# Patient Record
Sex: Female | Born: 1975 | Race: Black or African American | Hispanic: No | Marital: Married | Smoking: Current every day smoker
Health system: Southern US, Community
[De-identification: ages and names within clinical notes are randomized; demographics above are authoritative.]

---

## 2020-01-14 ENCOUNTER — Inpatient Hospital Stay (HOSPITAL_COMMUNITY)
Admission: EM | Admit: 2020-01-14 | Discharge: 2020-01-16 | DRG: 419 | Disposition: A | Discharge status: Home / Self Care | Payer: Medicaid Other | Attending: General Surgery | Admitting: General Surgery

## 2020-01-14 ENCOUNTER — Emergency Department (HOSPITAL_COMMUNITY): Payer: Medicaid Other

## 2020-01-14 ENCOUNTER — Encounter (HOSPITAL_COMMUNITY): Payer: Self-pay

## 2020-01-14 ENCOUNTER — Other Ambulatory Visit: Payer: Self-pay

## 2020-01-14 DIAGNOSIS — K219 Gastro-esophageal reflux disease without esophagitis: Secondary | ICD-10-CM | POA: Diagnosis present

## 2020-01-14 DIAGNOSIS — R1011 Right upper quadrant pain: Secondary | ICD-10-CM | POA: Diagnosis present

## 2020-01-14 DIAGNOSIS — Z79899 Other long term (current) drug therapy: Secondary | ICD-10-CM

## 2020-01-14 DIAGNOSIS — R739 Hyperglycemia, unspecified: Secondary | ICD-10-CM

## 2020-01-14 DIAGNOSIS — F17210 Nicotine dependence, cigarettes, uncomplicated: Secondary | ICD-10-CM | POA: Diagnosis present

## 2020-01-14 DIAGNOSIS — K8000 Calculus of gallbladder with acute cholecystitis without obstruction: Secondary | ICD-10-CM | POA: Diagnosis present

## 2020-01-14 DIAGNOSIS — K8020 Calculus of gallbladder without cholecystitis without obstruction: Secondary | ICD-10-CM

## 2020-01-14 DIAGNOSIS — Z888 Allergy status to other drugs, medicaments and biological substances status: Secondary | ICD-10-CM | POA: Diagnosis not present

## 2020-01-14 DIAGNOSIS — E669 Obesity, unspecified: Secondary | ICD-10-CM | POA: Diagnosis present

## 2020-01-14 DIAGNOSIS — Z6835 Body mass index (BMI) 35.0-35.9, adult: Secondary | ICD-10-CM

## 2020-01-14 DIAGNOSIS — K7581 Nonalcoholic steatohepatitis (NASH): Secondary | ICD-10-CM | POA: Diagnosis present

## 2020-01-14 DIAGNOSIS — Z90711 Acquired absence of uterus with remaining cervical stump: Secondary | ICD-10-CM

## 2020-01-14 DIAGNOSIS — Z20822 Contact with and (suspected) exposure to covid-19: Secondary | ICD-10-CM | POA: Diagnosis present

## 2020-01-14 DIAGNOSIS — K8012 Calculus of gallbladder with acute and chronic cholecystitis without obstruction: Secondary | ICD-10-CM | POA: Diagnosis present

## 2020-01-14 LAB — CBC WITH DIFFERENTIAL/PLATELET
Abs Immature Granulocytes: 0.06 10*3/uL (ref 0.00–0.07)
Basophils Absolute: 0 10*3/uL (ref 0.0–0.1)
Basophils Relative: 0 %
Eosinophils Absolute: 0 10*3/uL (ref 0.0–0.5)
Eosinophils Relative: 0 %
HCT: 41.9 % (ref 36.0–46.0)
Hemoglobin: 13.8 g/dL (ref 12.0–15.0)
Immature Granulocytes: 1 %
Lymphocytes Relative: 12 %
Lymphs Abs: 1.4 10*3/uL (ref 0.7–4.0)
MCH: 31.4 pg (ref 26.0–34.0)
MCHC: 32.9 g/dL (ref 30.0–36.0)
MCV: 95.2 fL (ref 80.0–100.0)
Monocytes Absolute: 0.3 10*3/uL (ref 0.1–1.0)
Monocytes Relative: 3 %
Neutro Abs: 10.4 10*3/uL — ABNORMAL HIGH (ref 1.7–7.7)
Neutrophils Relative %: 84 %
Platelets: 422 10*3/uL — ABNORMAL HIGH (ref 150–400)
RBC: 4.4 MIL/uL (ref 3.87–5.11)
RDW: 14.8 % (ref 11.5–15.5)
WBC: 12.2 10*3/uL — ABNORMAL HIGH (ref 4.0–10.5)
nRBC: 0 % (ref 0.0–0.2)

## 2020-01-14 LAB — COMPREHENSIVE METABOLIC PANEL
ALT: 25 U/L (ref 0–44)
AST: 21 U/L (ref 15–41)
Albumin: 4.2 g/dL (ref 3.5–5.0)
Alkaline Phosphatase: 58 U/L (ref 38–126)
Anion gap: 9 (ref 5–15)
BUN: 8 mg/dL (ref 6–20)
CO2: 28 mmol/L (ref 22–32)
Calcium: 8.8 mg/dL — ABNORMAL LOW (ref 8.9–10.3)
Chloride: 103 mmol/L (ref 98–111)
Creatinine, Ser: 0.67 mg/dL (ref 0.44–1.00)
GFR calc Af Amer: >60 mL/min (ref >60)
GFR calc non Af Amer: >60 mL/min (ref >60)
Glucose, Bld: 128 mg/dL — ABNORMAL HIGH (ref 70–99)
Potassium: 3.5 mmol/L (ref 3.5–5.1)
Sodium: 140 mmol/L (ref 135–145)
Total Bilirubin: 0.5 mg/dL (ref 0.3–1.2)
Total Protein: 8.1 g/dL (ref 6.5–8.1)

## 2020-01-14 LAB — URINALYSIS, ROUTINE W REFLEX MICROSCOPIC
Bilirubin Urine: NEGATIVE
Glucose, UA: NEGATIVE mg/dL
Hgb urine dipstick: NEGATIVE
Ketones, ur: 20 mg/dL — AB
Leukocytes,Ua: NEGATIVE
Nitrite: NEGATIVE
Protein, ur: NEGATIVE mg/dL
Specific Gravity, Urine: 1.016 (ref 1.005–1.030)
pH: 9 — ABNORMAL HIGH (ref 5.0–8.0)

## 2020-01-14 LAB — LIPASE, BLOOD: Lipase: 24 U/L (ref 11–51)

## 2020-01-14 LAB — RESPIRATORY PANEL BY RT PCR (FLU A&B, COVID)
Influenza A by PCR: NEGATIVE
Influenza B by PCR: NEGATIVE
SARS Coronavirus 2 by RT PCR: NEGATIVE

## 2020-01-14 LAB — I-STAT BETA HCG BLOOD, ED (MC, WL, AP ONLY): I-stat hCG, quantitative: <5 m[IU]/mL (ref <5)

## 2020-01-14 MED ORDER — ONDANSETRON 4 MG PO TBDP: 4.0000 mg | ORAL_TABLET | Freq: Four times a day (QID) | ORAL | Status: DC | PRN

## 2020-01-14 MED ORDER — MORPHINE SULFATE (PF) 2 MG/ML IV SOLN
1.0000 mg | INTRAVENOUS | Status: DC | PRN
  Administered 2020-01-14 – 2020-01-15 (×3): 2 mg via INTRAVENOUS
  Filled 2020-01-14 (×3): qty 1

## 2020-01-14 MED ORDER — ONDANSETRON HCL 4 MG/2ML IJ SOLN
4.0000 mg | Freq: Once | INTRAMUSCULAR | Status: AC
  Administered 2020-01-14: 15:00:00 4 mg via INTRAVENOUS
  Filled 2020-01-14: qty 2

## 2020-01-14 MED ORDER — HYDROMORPHONE HCL 1 MG/ML IJ SOLN
1.0000 mg | Freq: Once | INTRAMUSCULAR | Status: AC
  Administered 2020-01-14: 16:00:00 1 mg via INTRAVENOUS
  Filled 2020-01-14: qty 1

## 2020-01-14 MED ORDER — OXYCODONE HCL 5 MG PO TABS
5.0000 mg | ORAL_TABLET | ORAL | Status: DC | PRN
  Administered 2020-01-15 – 2020-01-16 (×4): 10 mg via ORAL
  Filled 2020-01-14 (×5): qty 2

## 2020-01-14 MED ORDER — ONDANSETRON 8 MG PO TBDP: 8.0000 mg | ORAL_TABLET | Freq: Once | ORAL | Status: DC

## 2020-01-14 MED ORDER — PROMETHAZINE HCL 25 MG/ML IJ SOLN
12.5000 mg | Freq: Once | INTRAMUSCULAR | Status: AC
  Administered 2020-01-14: 16:00:00 12.5 mg via INTRAVENOUS
  Filled 2020-01-14: qty 1

## 2020-01-14 MED ORDER — ONDANSETRON HCL 4 MG/2ML IJ SOLN
4.0000 mg | Freq: Four times a day (QID) | INTRAMUSCULAR | Status: DC | PRN
  Administered 2020-01-14: 4 mg via INTRAVENOUS
  Filled 2020-01-14: qty 2

## 2020-01-14 MED ORDER — HYDROMORPHONE HCL 1 MG/ML IJ SOLN
1.0000 mg | Freq: Once | INTRAMUSCULAR | Status: AC
  Administered 2020-01-14: 20:00:00 1 mg via INTRAVENOUS
  Filled 2020-01-14: qty 1

## 2020-01-14 MED ORDER — SODIUM CHLORIDE 0.9 % IV BOLUS
1000.0000 mL | Freq: Once | INTRAVENOUS | Status: AC
  Administered 2020-01-14: 18:00:00 1000 mL via INTRAVENOUS

## 2020-01-14 MED ORDER — MORPHINE SULFATE (PF) 4 MG/ML IV SOLN
4.0000 mg | Freq: Once | INTRAVENOUS | Status: AC
  Administered 2020-01-14: 15:00:00 4 mg via INTRAVENOUS
  Filled 2020-01-14: qty 1

## 2020-01-14 NOTE — ED Notes (Signed)
Attempted to call report to floor, RN in a room and will call right back.

## 2020-01-14 NOTE — ED Provider Notes (Signed)
[facility] DEPT Provider Note   CSN: [id number] Arrival date & time: 01/14/20  1257     History Chief Complaint  Patient presents with  . Abdominal Pain  . Emesis    Lindsey Gordon is a 44 y.o. female with no significant PMH who presents with abdominal pain. She states the pain started acuely around 2AM this morning. It is in the RUQ and radiates to the epigastric region and her back. She reports associated N/V that is bilious in nature. She tried several OTC meds for pain/gas without relief. She at crabs and cookout yesterday. She has had this pain before but it's never been this bad and usually resolves. The pain is severe, sharp, constant. No fever, chills, chest pain, SOB, diarrhea/constipation, urinary symptoms or vaginal discharge. She moved her from [facility] 2 months ago. She reports hx of partial hysterectomy.   HPI     History reviewed. No pertinent past medical history.  There are no problems to display for this patient.   History reviewed. No pertinent surgical history.   OB History    Gravida  5   Para  5   Term  5   Preterm      AB      Living  5     SAB      TAB      Ectopic      Multiple      Live Births              History reviewed. No pertinent family history.  Social History   Tobacco Use  . Smoking status: Current Every Day Smoker    Packs/day: 0.25    Types: Cigarettes  . Smokeless tobacco: Never Used  Substance Use Topics  . Alcohol use: Not Currently  . Drug use: Never    Home Medications Prior to Admission medications   Not on File    Allergies    Patient has no allergy information on record.  Review of Systems   Review of Systems  Constitutional: Negative for chills and fever.  Respiratory: Negative for shortness of breath.   Cardiovascular: Negative for chest pain.  Gastrointestinal: Positive for abdominal pain, nausea and vomiting. Negative for constipation and diarrhea.    Genitourinary: Negative for dysuria, flank pain, pelvic pain, vaginal bleeding and vaginal discharge.  Musculoskeletal: Positive for back pain.  All other systems reviewed and are negative.   Physical Exam Updated Vital Signs BP (!) 147/84 (BP Location: Left Arm)   Pulse 68   Temp 98.5 F (36.9 C) (Oral)   Resp 19   Ht 5\' 5"  (1.651 m)   Wt 95.3 kg   SpO2 96%   BMI 34.95 kg/m   Physical Exam Vitals and nursing note reviewed.  Constitutional:      General: She is in acute distress (rocking back and forth).     Appearance: She is well-developed. She is obese.  HENT:     Head: Normocephalic and atraumatic.  Eyes:     General: No scleral icterus.       Right eye: No discharge.        Left eye: No discharge.     Conjunctiva/sclera: Conjunctivae normal.     Pupils: Pupils are equal, round, and reactive to light.  Cardiovascular:     Rate and Rhythm: Normal rate and regular rhythm.  Pulmonary:     Effort: Pulmonary effort is normal. No respiratory distress.     Breath sounds: Normal  breath sounds.  Abdominal:     General: There is no distension.     Palpations: Abdomen is soft.     Tenderness: There is abdominal tenderness (epigastric and RUQ tenderness).  Musculoskeletal:     Cervical back: Normal range of motion.  Skin:    General: Skin is warm and dry.  Neurological:     Mental Status: She is alert and oriented to person, place, and time.  Psychiatric:        Behavior: Behavior normal.     ED Results / Procedures / Treatments   Labs (all labs ordered are listed, but only abnormal results are displayed) Labs Reviewed  CBC WITH DIFFERENTIAL/PLATELET - Abnormal; Notable for the following components:      Result Value   WBC 12.2 (*)    Platelets 422 (*)    Neutro Abs 10.4 (*)    All other components within normal limits  COMPREHENSIVE METABOLIC PANEL - Abnormal; Notable for the following components:   Glucose, Bld 128 (*)    Calcium 8.8 (*)    All other  components within normal limits  URINALYSIS, ROUTINE W REFLEX MICROSCOPIC - Abnormal; Notable for the following components:   pH 9.0 (*)    Ketones, ur 20 (*)    All other components within normal limits  COMPREHENSIVE METABOLIC PANEL - Abnormal; Notable for the following components:   Potassium 3.1 (*)    Glucose, Bld 131 (*)    BUN <5 (*)    Calcium 8.4 (*)    All other components within normal limits  CBC - Abnormal; Notable for the following components:   WBC 16.7 (*)    Platelets 429 (*)    All other components within normal limits  RESPIRATORY PANEL BY RT PCR (FLU A&B, COVID)  LIPASE, BLOOD  HIV ANTIBODY (ROUTINE TESTING W REFLEX)  I-STAT BETA HCG BLOOD, ED ([facility], [facility], [facility] ONLY)    EKG None  Radiology No results found.  Procedures Procedures (including critical care time)  Medications Ordered in ED Medications  morphine 4 MG/ML injection 4 mg (has no administration in time range)  ondansetron (ZOFRAN) injection 4 mg (has no administration in time range)    ED Course  I have reviewed the triage vital signs and the nursing notes.  Pertinent labs & imaging results that were available during my care of the patient were reviewed by me and considered in my medical decision making (see chart for details).  44 year old with acute RUQ abdominal pain, N/V. She is mildly hypertensive but otherwise vitals are normal. She is tender in the epigastric and RUQ. Will obtain labs, UA, RUQ [place]. Will order Morphine and Zofran.  Pt had no relief with meds. Will order Dilaudid and Phenergan as well as fluids.   CBC shows mild leukocytosis (12). CMP and lipase are reassuring. UA is still pending. RUQ [place] is pending as well at shift change. Care signed out to Dr. Jeraldine Loots who will dispo.  MDM Rules/Calculators/A&P                       Final Clinical Impression(s) / ED Diagnoses Final diagnoses:  RUQ abdominal pain    Rx / DC Orders ED Discharge Orders    None       Bethel Born, PA-C 01/15/20 0915    Gerhard Munch, MD 01/15/20 2116

## 2020-01-14 NOTE — ED Triage Notes (Signed)
Patient states she ate crab legs and cookout yesterday between noon and 3pm, patient says around 5pm she went to take a nap and she developed a pain in her epigastric region and it is radiating to her right side and back. Patient actively vomiting in triage. Patient states she has thrown up 7 times since yesterday.
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
## 2020-01-15 ENCOUNTER — Encounter (HOSPITAL_COMMUNITY): Payer: Self-pay

## 2020-01-15 ENCOUNTER — Encounter (HOSPITAL_COMMUNITY): Admission: EM | Disposition: A | Discharge status: Home / Self Care | Payer: Self-pay | Source: Home / Self Care

## 2020-01-15 ENCOUNTER — Inpatient Hospital Stay (HOSPITAL_COMMUNITY): Payer: Medicaid Other | Admitting: Anesthesiology

## 2020-01-15 DIAGNOSIS — E669 Obesity, unspecified: Secondary | ICD-10-CM

## 2020-01-15 DIAGNOSIS — R739 Hyperglycemia, unspecified: Secondary | ICD-10-CM

## 2020-01-15 DIAGNOSIS — K219 Gastro-esophageal reflux disease without esophagitis: Secondary | ICD-10-CM

## 2020-01-15 HISTORY — PX: CHOLECYSTECTOMY: SHX55

## 2020-01-15 LAB — COMPREHENSIVE METABOLIC PANEL
ALT: 24 U/L (ref 0–44)
AST: 20 U/L (ref 15–41)
Albumin: 3.7 g/dL (ref 3.5–5.0)
Alkaline Phosphatase: 49 U/L (ref 38–126)
Anion gap: 12 (ref 5–15)
BUN: <5 mg/dL — ABNORMAL LOW (ref 6–20)
CO2: 26 mmol/L (ref 22–32)
Calcium: 8.4 mg/dL — ABNORMAL LOW (ref 8.9–10.3)
Chloride: 101 mmol/L (ref 98–111)
Creatinine, Ser: 0.62 mg/dL (ref 0.44–1.00)
GFR calc Af Amer: >60 mL/min (ref >60)
GFR calc non Af Amer: >60 mL/min (ref >60)
Glucose, Bld: 131 mg/dL — ABNORMAL HIGH (ref 70–99)
Potassium: 3.1 mmol/L — ABNORMAL LOW (ref 3.5–5.1)
Sodium: 139 mmol/L (ref 135–145)
Total Bilirubin: 0.6 mg/dL (ref 0.3–1.2)
Total Protein: 7 g/dL (ref 6.5–8.1)

## 2020-01-15 LAB — CBC
HCT: 39.5 % (ref 36.0–46.0)
Hemoglobin: 12.7 g/dL (ref 12.0–15.0)
MCH: 30.5 pg (ref 26.0–34.0)
MCHC: 32.2 g/dL (ref 30.0–36.0)
MCV: 94.7 fL (ref 80.0–100.0)
Platelets: 429 10*3/uL — ABNORMAL HIGH (ref 150–400)
RBC: 4.17 MIL/uL (ref 3.87–5.11)
RDW: 14.7 % (ref 11.5–15.5)
WBC: 16.7 10*3/uL — ABNORMAL HIGH (ref 4.0–10.5)
nRBC: 0 % (ref 0.0–0.2)

## 2020-01-15 LAB — HIV ANTIBODY (ROUTINE TESTING W REFLEX): HIV Screen 4th Generation wRfx: NONREACTIVE

## 2020-01-15 SURGERY — LAPAROSCOPIC CHOLECYSTECTOMY WITH INTRAOPERATIVE CHOLANGIOGRAM
Anesthesia: General | Site: Abdomen

## 2020-01-15 MED ORDER — PROCHLORPERAZINE MALEATE 10 MG PO TABS
10.0000 mg | ORAL_TABLET | Freq: Four times a day (QID) | ORAL | Status: DC | PRN
  Filled 2020-01-15: qty 1

## 2020-01-15 MED ORDER — ONDANSETRON HCL 4 MG/2ML IJ SOLN
INTRAMUSCULAR | Status: DC | PRN
  Administered 2020-01-15: 4 mg via INTRAVENOUS

## 2020-01-15 MED ORDER — CALCIUM CARBONATE ANTACID 500 MG PO CHEW: 1.0000 | CHEWABLE_TABLET | ORAL | Status: DC | PRN

## 2020-01-15 MED ORDER — BUPIVACAINE-EPINEPHRINE (PF) 0.5% -1:200000 IJ SOLN
INTRAMUSCULAR | Status: AC
  Filled 2020-01-15: qty 30

## 2020-01-15 MED ORDER — ROCURONIUM BROMIDE 10 MG/ML (PF) SYRINGE
PREFILLED_SYRINGE | INTRAVENOUS | Status: AC
  Filled 2020-01-15: qty 10

## 2020-01-15 MED ORDER — BUPIVACAINE-EPINEPHRINE 0.5% -1:200000 IJ SOLN
INTRAMUSCULAR | Status: DC | PRN
  Administered 2020-01-15: 30 mL

## 2020-01-15 MED ORDER — MAGIC MOUTHWASH
15.0000 mL | Freq: Four times a day (QID) | ORAL | Status: DC | PRN
  Filled 2020-01-15: qty 15

## 2020-01-15 MED ORDER — STERILE WATER FOR IRRIGATION IR SOLN
Status: DC | PRN
  Administered 2020-01-15: 2000 mL

## 2020-01-15 MED ORDER — GUAIFENESIN-DM 100-10 MG/5ML PO SYRP: 10.0000 mL | ORAL_SOLUTION | ORAL | Status: DC | PRN

## 2020-01-15 MED ORDER — METRONIDAZOLE IN NACL 5-0.79 MG/ML-% IV SOLN
INTRAVENOUS | Status: AC
  Filled 2020-01-15: qty 100

## 2020-01-15 MED ORDER — IBUPROFEN 400 MG PO TABS: 600.0000 mg | ORAL_TABLET | Freq: Four times a day (QID) | ORAL | Status: DC | PRN

## 2020-01-15 MED ORDER — ACETAMINOPHEN 500 MG PO TABS
1000.0000 mg | ORAL_TABLET | Freq: Three times a day (TID) | ORAL | Status: DC
  Administered 2020-01-15 – 2020-01-16 (×3): 1000 mg via ORAL
  Filled 2020-01-15 (×3): qty 2

## 2020-01-15 MED ORDER — LIDOCAINE 2% (20 MG/ML) 5 ML SYRINGE
INTRAMUSCULAR | Status: AC
  Filled 2020-01-15: qty 5

## 2020-01-15 MED ORDER — ACETAMINOPHEN 325 MG PO TABS: 650.0000 mg | ORAL_TABLET | Freq: Four times a day (QID) | ORAL | Status: DC | PRN

## 2020-01-15 MED ORDER — CELECOXIB 200 MG PO CAPS
ORAL_CAPSULE | ORAL | Status: AC
  Filled 2020-01-15: qty 1

## 2020-01-15 MED ORDER — BUPIVACAINE LIPOSOME 1.3 % IJ SUSP
20.0000 mL | Freq: Once | INTRAMUSCULAR | Status: AC
  Administered 2020-01-15: 20 mL
  Filled 2020-01-15: qty 20

## 2020-01-15 MED ORDER — ONDANSETRON HCL 4 MG/2ML IJ SOLN
INTRAMUSCULAR | Status: AC
  Filled 2020-01-15: qty 2

## 2020-01-15 MED ORDER — FENTANYL CITRATE (PF) 100 MCG/2ML IJ SOLN
INTRAMUSCULAR | Status: AC
  Filled 2020-01-15: qty 2

## 2020-01-15 MED ORDER — LORAZEPAM 2 MG/ML IJ SOLN: 0.5000 mg | Freq: Three times a day (TID) | INTRAMUSCULAR | Status: DC | PRN

## 2020-01-15 MED ORDER — PANTOPRAZOLE SODIUM 40 MG PO TBEC
40.0000 mg | DELAYED_RELEASE_TABLET | Freq: Every day | ORAL | Status: DC
  Administered 2020-01-15 – 2020-01-16 (×2): 40 mg via ORAL
  Filled 2020-01-15 (×2): qty 1

## 2020-01-15 MED ORDER — LIP MEDEX EX OINT
1.0000 "application " | TOPICAL_OINTMENT | Freq: Two times a day (BID) | CUTANEOUS | Status: DC
  Administered 2020-01-15 – 2020-01-16 (×3): 1 via TOPICAL
  Filled 2020-01-15 (×2): qty 7

## 2020-01-15 MED ORDER — SODIUM CHLORIDE 0.9 % IR SOLN
Status: DC | PRN
  Administered 2020-01-15: 1000 mL

## 2020-01-15 MED ORDER — PROPOFOL 10 MG/ML IV BOLUS
INTRAVENOUS | Status: AC
  Filled 2020-01-15: qty 20

## 2020-01-15 MED ORDER — SODIUM CHLORIDE 0.9% FLUSH: 3.0000 mL | INTRAVENOUS | Status: DC | PRN

## 2020-01-15 MED ORDER — ALUM & MAG HYDROXIDE-SIMETH 200-200-20 MG/5ML PO SUSP: 30.0000 mL | Freq: Four times a day (QID) | ORAL | Status: DC | PRN

## 2020-01-15 MED ORDER — SODIUM CHLORIDE 0.9% FLUSH
3.0000 mL | Freq: Two times a day (BID) | INTRAVENOUS | Status: DC
  Administered 2020-01-15 – 2020-01-16 (×3): 3 mL via INTRAVENOUS

## 2020-01-15 MED ORDER — HYDROMORPHONE HCL 1 MG/ML IJ SOLN: 0.5000 mg | INTRAMUSCULAR | Status: DC | PRN

## 2020-01-15 MED ORDER — ENALAPRILAT 1.25 MG/ML IV SOLN
0.6250 mg | Freq: Four times a day (QID) | INTRAVENOUS | Status: DC | PRN
  Filled 2020-01-15: qty 1

## 2020-01-15 MED ORDER — LIDOCAINE 2% (20 MG/ML) 5 ML SYRINGE
INTRAMUSCULAR | Status: DC | PRN
  Administered 2020-01-15: 60 mg via INTRAVENOUS

## 2020-01-15 MED ORDER — ENOXAPARIN SODIUM 40 MG/0.4ML ~~LOC~~ SOLN
40.0000 mg | SUBCUTANEOUS | Status: DC
  Administered 2020-01-16: 40 mg via SUBCUTANEOUS
  Filled 2020-01-15: qty 0.4

## 2020-01-15 MED ORDER — HYDROCORTISONE 1 % EX CREA
1.0000 "application " | TOPICAL_CREAM | Freq: Three times a day (TID) | CUTANEOUS | Status: DC | PRN
  Filled 2020-01-15: qty 28

## 2020-01-15 MED ORDER — ROCURONIUM BROMIDE 10 MG/ML (PF) SYRINGE
PREFILLED_SYRINGE | INTRAVENOUS | Status: DC | PRN
  Administered 2020-01-15: 50 mg via INTRAVENOUS

## 2020-01-15 MED ORDER — CELECOXIB 200 MG PO CAPS
200.0000 mg | ORAL_CAPSULE | Freq: Once | ORAL | Status: AC
  Administered 2020-01-15: 200 mg via ORAL

## 2020-01-15 MED ORDER — ACETAMINOPHEN 500 MG PO TABS
1000.0000 mg | ORAL_TABLET | Freq: Once | ORAL | Status: AC
  Administered 2020-01-15: 1000 mg via ORAL

## 2020-01-15 MED ORDER — PROPOFOL 10 MG/ML IV BOLUS
INTRAVENOUS | Status: DC | PRN
  Administered 2020-01-15: 150 mg via INTRAVENOUS

## 2020-01-15 MED ORDER — ACETAMINOPHEN 500 MG PO TABS
ORAL_TABLET | ORAL | Status: AC
  Filled 2020-01-15: qty 2

## 2020-01-15 MED ORDER — METRONIDAZOLE IN NACL 5-0.79 MG/ML-% IV SOLN
500.0000 mg | Freq: Once | INTRAVENOUS | Status: AC
  Administered 2020-01-15: 500 mg via INTRAVENOUS
  Filled 2020-01-15: qty 100

## 2020-01-15 MED ORDER — MIDAZOLAM HCL 2 MG/2ML IJ SOLN
INTRAMUSCULAR | Status: DC | PRN
  Administered 2020-01-15: 2 mg via INTRAVENOUS

## 2020-01-15 MED ORDER — PHENOL 1.4 % MT LIQD
2.0000 | OROMUCOSAL | Status: DC | PRN
  Filled 2020-01-15: qty 177

## 2020-01-15 MED ORDER — LACTATED RINGERS IV BOLUS: 1000.0000 mL | Freq: Three times a day (TID) | INTRAVENOUS | Status: DC | PRN

## 2020-01-15 MED ORDER — DIPHENHYDRAMINE HCL 50 MG/ML IJ SOLN: 12.5000 mg | Freq: Four times a day (QID) | INTRAMUSCULAR | Status: DC | PRN

## 2020-01-15 MED ORDER — LACTATED RINGERS IV SOLN: INTRAVENOUS | Status: DC | PRN

## 2020-01-15 MED ORDER — SUGAMMADEX SODIUM 200 MG/2ML IV SOLN
INTRAVENOUS | Status: DC | PRN
  Administered 2020-01-15: 200 mg via INTRAVENOUS

## 2020-01-15 MED ORDER — DIPHENHYDRAMINE HCL 12.5 MG/5ML PO ELIX: 12.5000 mg | ORAL_SOLUTION | Freq: Four times a day (QID) | ORAL | Status: DC | PRN

## 2020-01-15 MED ORDER — MENTHOL 3 MG MT LOZG: 1.0000 | LOZENGE | OROMUCOSAL | Status: DC | PRN

## 2020-01-15 MED ORDER — PROMETHAZINE HCL 25 MG/ML IJ SOLN: 6.2500 mg | INTRAMUSCULAR | Status: DC | PRN

## 2020-01-15 MED ORDER — ACETAMINOPHEN 650 MG RE SUPP: 650.0000 mg | Freq: Four times a day (QID) | RECTAL | Status: DC | PRN

## 2020-01-15 MED ORDER — FENTANYL CITRATE (PF) 250 MCG/5ML IJ SOLN
INTRAMUSCULAR | Status: DC | PRN
  Administered 2020-01-15 (×3): 50 ug via INTRAVENOUS
  Administered 2020-01-15: 100 ug via INTRAVENOUS
  Administered 2020-01-15 (×2): 50 ug via INTRAVENOUS

## 2020-01-15 MED ORDER — GABAPENTIN 300 MG PO CAPS
300.0000 mg | ORAL_CAPSULE | Freq: Two times a day (BID) | ORAL | Status: DC
  Administered 2020-01-15 – 2020-01-16 (×3): 300 mg via ORAL
  Filled 2020-01-15 (×3): qty 1

## 2020-01-15 MED ORDER — SUCCINYLCHOLINE CHLORIDE 200 MG/10ML IV SOSY
PREFILLED_SYRINGE | INTRAVENOUS | Status: DC | PRN
  Administered 2020-01-15: 140 mg via INTRAVENOUS

## 2020-01-15 MED ORDER — METHOCARBAMOL 500 MG PO TABS: 500.0000 mg | ORAL_TABLET | Freq: Four times a day (QID) | ORAL | Status: DC | PRN

## 2020-01-15 MED ORDER — DEXAMETHASONE SODIUM PHOSPHATE 10 MG/ML IJ SOLN
INTRAMUSCULAR | Status: DC | PRN
  Administered 2020-01-15: 10 mg via INTRAVENOUS

## 2020-01-15 MED ORDER — DEXAMETHASONE SODIUM PHOSPHATE 10 MG/ML IJ SOLN
INTRAMUSCULAR | Status: AC
  Filled 2020-01-15: qty 1

## 2020-01-15 MED ORDER — LACTATED RINGERS IR SOLN
Status: DC | PRN
  Administered 2020-01-15: 1000 mL

## 2020-01-15 MED ORDER — DEXTROSE IN LACTATED RINGERS 5 % IV SOLN: INTRAVENOUS | Status: DC

## 2020-01-15 MED ORDER — PROCHLORPERAZINE EDISYLATE 10 MG/2ML IJ SOLN: 5.0000 mg | Freq: Four times a day (QID) | INTRAMUSCULAR | Status: DC | PRN

## 2020-01-15 MED ORDER — SIMETHICONE 80 MG PO CHEW: 40.0000 mg | CHEWABLE_TABLET | Freq: Four times a day (QID) | ORAL | Status: DC | PRN

## 2020-01-15 MED ORDER — FENTANYL CITRATE (PF) 100 MCG/2ML IJ SOLN: 25.0000 ug | INTRAMUSCULAR | Status: DC | PRN

## 2020-01-15 MED ORDER — MIDAZOLAM HCL 2 MG/2ML IJ SOLN
INTRAMUSCULAR | Status: AC
  Filled 2020-01-15: qty 2

## 2020-01-15 MED ORDER — SODIUM CHLORIDE 0.9 % IV SOLN
2.0000 g | Freq: Every day | INTRAVENOUS | Status: DC
  Administered 2020-01-15 (×2): 2 g via INTRAVENOUS
  Filled 2020-01-15 (×2): qty 2

## 2020-01-15 MED ORDER — HYDROCORTISONE (PERIANAL) 2.5 % EX CREA
1.0000 "application " | TOPICAL_CREAM | Freq: Four times a day (QID) | CUTANEOUS | Status: DC | PRN
  Filled 2020-01-15: qty 28.35

## 2020-01-15 MED ORDER — FENTANYL CITRATE (PF) 250 MCG/5ML IJ SOLN
INTRAMUSCULAR | Status: AC
  Filled 2020-01-15: qty 5

## 2020-01-15 MED ORDER — SODIUM CHLORIDE 0.9 % IV SOLN: 250.0000 mL | INTRAVENOUS | Status: DC | PRN

## 2020-01-15 SURGICAL SUPPLY — 41 items
APPLIER CLIP 5 13 M/L LIGAMAX5 (MISCELLANEOUS) ×3
APPLIER CLIP ROT 10 11.4 M/L (STAPLE)
CABLE HIGH FREQUENCY MONO STRZ (ELECTRODE) ×3 IMPLANT
CLIP APPLIE 5 13 M/L LIGAMAX5 (MISCELLANEOUS) ×1 IMPLANT
CLIP APPLIE ROT 10 11.4 M/L (STAPLE) IMPLANT
COVER MAYO STAND STRL (DRAPES) ×3 IMPLANT
COVER SURGICAL LIGHT HANDLE (MISCELLANEOUS) ×3 IMPLANT
COVER WAND RF STERILE (DRAPES) ×3 IMPLANT
DECANTER SPIKE VIAL GLASS SM (MISCELLANEOUS) ×3 IMPLANT
DRAPE C-ARM 42X120 X-RAY (DRAPES) ×3 IMPLANT
DRAPE UTILITY XL STRL (DRAPES) ×3 IMPLANT
DRAPE WARM FLUID 44X44 (DRAPES) ×3 IMPLANT
DRSG TEGADERM 2-3/8X2-3/4 SM (GAUZE/BANDAGES/DRESSINGS) ×12 IMPLANT
ELECT REM PT RETURN 15FT ADLT (MISCELLANEOUS) ×3 IMPLANT
ENDOLOOP SUT PDS II  0 18 (SUTURE)
ENDOLOOP SUT PDS II 0 18 (SUTURE) IMPLANT
GLOVE ECLIPSE 8.0 STRL XLNG CF (GLOVE) ×3 IMPLANT
GLOVE INDICATOR 8.0 STRL GRN (GLOVE) ×3 IMPLANT
GOWN STRL REUS W/TWL XL LVL3 (GOWN DISPOSABLE) ×6 IMPLANT
IRRIG SUCT STRYKERFLOW 2 WTIP (MISCELLANEOUS) ×3
IRRIGATION SUCT STRKRFLW 2 WTP (MISCELLANEOUS) ×1 IMPLANT
KIT BASIN OR (CUSTOM PROCEDURE TRAY) ×3 IMPLANT
KIT TURNOVER KIT A (KITS) IMPLANT
PAD POSITIONING PINK XL (MISCELLANEOUS) ×3 IMPLANT
PENCIL SMOKE EVACUATOR (MISCELLANEOUS) IMPLANT
POUCH RETRIEVAL ECOSAC 10 (ENDOMECHANICALS) ×1 IMPLANT
POUCH RETRIEVAL ECOSAC 10MM (ENDOMECHANICALS) ×2
PROTECTOR NERVE ULNAR (MISCELLANEOUS) ×3 IMPLANT
SCISSORS LAP 5X35 DISP (ENDOMECHANICALS) ×3 IMPLANT
SET CHOLANGIOGRAPH MIX (MISCELLANEOUS) ×3 IMPLANT
SET TUBE SMOKE EVAC HIGH FLOW (TUBING) ×3 IMPLANT
SHEARS HARMONIC ACE PLUS 36CM (ENDOMECHANICALS) ×3 IMPLANT
SLEEVE XCEL OPT CAN 5 100 (ENDOMECHANICALS) ×3 IMPLANT
SUT MNCRL AB 4-0 PS2 18 (SUTURE) ×3 IMPLANT
SYR 20ML LL LF (SYRINGE) ×3 IMPLANT
TOWEL OR 17X26 10 PK STRL BLUE (TOWEL DISPOSABLE) ×3 IMPLANT
TOWEL OR NON WOVEN STRL DISP B (DISPOSABLE) ×3 IMPLANT
TRAY LAPAROSCOPIC (CUSTOM PROCEDURE TRAY) ×3 IMPLANT
TROCAR BLADELESS OPT 5 100 (ENDOMECHANICALS) ×3 IMPLANT
TROCAR BLADELESS OPT 5 150 (ENDOMECHANICALS) ×3 IMPLANT
TROCAR XCEL NON-BLD 11X100MML (ENDOMECHANICALS) ×3 IMPLANT

## 2020-01-15 NOTE — Op Note (Signed)
01/15/2020  PATIENT:  Lindsey Gordon  44 y.o. female  Patient Care Team: Patient, No Pcp Per as PCP - General (General Practice)  PRE-OPERATIVE DIAGNOSIS:    Acute on Chronic Calculus Cholecystitis  POST-OPERATIVE DIAGNOSIS:   Acute on Chronic Calculus Cholecystitis  Liver: Fatty steatohepatitis  PROCEDURE:  SINGLE SITE Laparoscopic cholecystectomy  SURGEON:  Ardeth Sportsman, MD, FACS.  ASSISTANT: Marjory Lies, PA-S, [employer]   ANESTHESIA:    General with endotracheal intubation Local anesthetic as a field block  EBL:  (See Anesthesia Intraoperative Record) Total I/O In: 1380 [P.O.:30; I.V.:1250; IV Piggyback:100] Out: 75 [Blood:75]  Delay start of Pharmacological VTE agent (>24hrs) due to surgical blood loss or risk of bleeding:  no  DRAINS: None   SPECIMEN: Gallbladder    DISPOSITION OF SPECIMEN:  PATHOLOGY  COUNTS:  YES  PLAN OF CARE: Admit to inpatient   PATIENT DISPOSITION:  PACU - hemodynamically stable.  INDICATION: Obese woman with biliary colic that persisted.  Examination and ultrasound concerning for cholecystitis.  Admitted on antibiotics.  We offered cholecystectomy  The anatomy & physiology of hepatobiliary & pancreatic function was discussed.  The pathophysiology of gallbladder dysfunction was discussed.  Natural history risks without surgery was discussed.   I feel the risks of no intervention will lead to serious problems that outweigh the operative risks; therefore, I recommended cholecystectomy to remove the pathology.  I explained laparoscopic techniques with possible need for an open approach.  Probable cholangiogram to evaluate the bilary tract was explained as well.    Risks such as bleeding, infection, abscess, leak, injury to other organs, need for further treatment, heart attack, death, and other risks were discussed.  I noted a good likelihood this will help address the problem.  Possibility that this will not correct all abdominal  symptoms was explained.  Goals of post-operative recovery were discussed as well.  We will work to minimize complications.  An educational handout further explaining the pathology and treatment options was given as well.  Questions were answered.  The patient expresses understanding & wishes to proceed with surgery.  OR FINDINGS: Edematous swollen gallbladder with chronic changes consistent with acute on chronic cholecystitis.  Very large gallstone impacted at the infundibulum as suspected on ultrasound.  Cystic duct very small and tenuous.  Not able to do cholangiogram.  Liver: Fatty steatohepatitis   CASE DATA:  Type of patient?: LDOW CASE (Surgical Hospitalist [facility] Inpatient)  Status of Case? URGENT Add On  Infection Present At Time Of Surgery (PATOS)?  PHLEGMON       DESCRIPTION:   The patient was identified & brought in the operating room. The patient was positioned supine with arms tucked. SCDs were active during the entire case. The patient underwent general anesthesia without any difficulty.  The abdomen was prepped and draped in a sterile fashion. A Surgical Timeout confirmed our plan.  I made a transverse curvilinear incision through the superior umbilical fold.  I placed a 74mm long port through the supraumbilical fascia using a modified Hassan cutdown technique with umbilical stalk fascial countertraction. I began carbon dioxide insufflation.  No change in end tidal CO2 measurement.   Camera inspection revealed no injury. There were no adhesions to the anterior abdominal wall supraumbilically.  I proceeded to continue with single site technique. I placed a #5 port in left upper aspect of the wound. I placed a 5 mm atraumatic grasper in the right inferior aspect of the wound.  I turned attention to the right  upper quadrant.  The gallbladder was edematous and swollen and turgid.  Inflammation and phlegmon around it, consistent with acute on chronic cholecystitis.  The gallbladder  fundus was elevated cephalad.   At grasping it the gallbladder did open up and there was spillage of bile.  No stones though.  I freed adhesions to the ventral surface of the gallbladder off carefully.  I freed the peritoneal coverings between the gallbladder and the liver on the posteriolateral and anteriomedial walls. I alternated between Harmonic & blunt Maryland dissection to help get a good critical view of the cystic artery and cystic duct.  did further dissection to free 50% of the gallbladder off the liver bed to get a good critical view of the infundibulum and cystic duct. I dissected out the cystic artery; and, after getting a good 360 view, ligated the anterior & posterior branches of the cystic artery close on the infundibulum using the Harmonic ultrasonic dissection.  I skeletonized the cystic duct.  I placed a clip on the infundibulum. I did a partial cystic duct-otomy and ensured patency. I placed a 5 [place] cholangiocatheter through a puncture site at the right subcostal ridge of the abdominal wall and directed it into the cystic duct.  It could not pass.  I milked back from the common bile duct up and released no stones.  Reoriented but still could not get the catheter to pass.  Not at a valve.  The cystic duct lumen was too narrow.  Therefore I aborted on cholangiogram since we had an excellent critical view.  Liver function tests were not severely elevated so my suspicious for choledocholithiasis was relatively low.   I removed the cholangiocatheter. I placed clips on the cystic duct x4.  I completed cystic duct transection. I freed the gallbladder from its remaining attachments to the liver.  I ensured hemostasis on the gallbladder fossa of the liver and elsewhere. I inspected the rest of the abdomen & detected no injury nor bleeding elsewhere.  Placed gallbladder inside the ecosac.  I removed the gallbladder out the supraumbilical fascia.  I had to open up to 2.5 cm to get the gallbladder  with its very large stone out.   I closed the fascia transversely using  #1 PDS interrupted stitches. I closed the skin using 4-0 monocryl stitch.  Sterile dressing was applied. The patient was extubated & arrived in the PACU in stable condition..  I had discussed postoperative care with the patient in the holding area. I made an attempt to locate family to discuss patient's status and recommendations.  Not able to reach her husband on his cell phone x2 attempts.  No one is available at this time.  Instructions are written.  Patient stable in recovery room.  Updated patient and PACU nurses.  We will follow the patient closely.  Most likely the patient will need to stay overnight with her pain and nausea and vomiting.  Hopefully home tomorrow if recovers quickly  Adin Hector, M.D., F.A.C.S. Gastrointestinal and Minimally Invasive Surgery [facility] [address], [place], [place] [address] [phone] Main / Paging  01/15/2020 10:09 AM

## 2020-01-15 NOTE — Anesthesia Procedure Notes (Signed)
Procedure Name: Intubation Date/Time: 01/15/2020 8:18 AM Performed by: Florene Route, CRNA Patient Re-evaluated:Patient Re-evaluated prior to induction Oxygen Delivery Method: Circle system utilized Preoxygenation: Pre-oxygenation with 100% oxygen Induction Type: IV induction, Rapid sequence and Cricoid Pressure applied Laryngoscope Size: Miller and 2 Grade View: Grade I Tube type: Oral Tube size: 7.5 mm Number of attempts: 1 Airway Equipment and Method: Stylet Placement Confirmation: ETT inserted through vocal cords under direct vision,  positive ETCO2 and breath sounds checked- equal and bilateral Secured at: 21 cm Tube secured with: Tape Dental Injury: Teeth and Oropharynx as per pre-operative assessment

## 2020-01-15 NOTE — Anesthesia Preprocedure Evaluation (Addendum)
Anesthesia Evaluation  Patient identified by MRN, date of birth, ID band Patient awake    Reviewed: Allergy & Precautions, NPO status , Patient's Chart, lab work & pertinent test results  Airway Mallampati: II  TM Distance: >3 FB Neck ROM: Full    Dental no notable dental hx. (+) Dental Advisory Given   Pulmonary Current Smoker,    Pulmonary exam normal        Cardiovascular negative cardio ROS Normal cardiovascular exam     Neuro/Psych negative neurological ROS     GI/Hepatic negative GI ROS, Neg liver ROS,   Endo/Other  negative endocrine ROS  Renal/GU negative Renal ROS     Musculoskeletal negative musculoskeletal ROS (+)   Abdominal   Peds  Hematology negative hematology ROS (+)   Anesthesia Other Findings Day of surgery medications reviewed with the patient.  Reproductive/Obstetrics                            Anesthesia Physical Anesthesia Plan  ASA: II  Anesthesia Plan: General   Post-op Pain Management:    Induction: Intravenous  PONV Risk Score and Plan: 3 and Ondansetron, Dexamethasone, Scopolamine patch - Pre-op and Midazolam  Airway Management Planned: Oral ETT  Additional Equipment:   Intra-op Plan:   Post-operative Plan: Extubation in OR  Informed Consent: I have reviewed the patients History and Physical, chart, labs and discussed the procedure including the risks, benefits and alternatives for the proposed anesthesia with the patient or authorized representative who has indicated his/her understanding and acceptance.     Dental advisory given  Plan Discussed with: Anesthesiologist, CRNA and Surgeon  Anesthesia Plan Comments:        Anesthesia Quick Evaluation

## 2020-01-15 NOTE — Progress Notes (Signed)
I discussed operative findings, updated the patient's status, discussed probable steps to recovery, and gave postoperative recommendations to the patient's spouse.  Recommendations were made.  Questions were answered.  He expressed understanding & appreciation.

## 2020-01-15 NOTE — Transfer of Care (Signed)
Immediate Anesthesia Transfer of Care Note  Patient: Lindsey Gordon  Procedure(s) Performed: LAPAROSCOPIC CHOLECYSTECTOMY single site (N/A Abdomen)  Patient Location: PACU  Anesthesia Type:General  Level of Consciousness: awake and alert   Airway & Oxygen Therapy: Patient Spontanous Breathing and Patient connected to face mask oxygen  Post-op Assessment: Report given to RN and Post -op Vital signs reviewed and stable  Post vital signs: Reviewed and stable  Last Vitals:  Vitals Value Taken Time  BP    Temp    Pulse 96 01/15/20 1006  Resp 12 01/15/20 1006  SpO2 100 % 01/15/20 1006  Vitals shown include unvalidated device data.  Last Pain:  Vitals:   01/15/20 0631  TempSrc: Oral  PainSc:          Complications: No apparent anesthesia complications

## 2020-01-15 NOTE — Anesthesia Postprocedure Evaluation (Signed)
Anesthesia Post Note  Patient: Lindsey Gordon  Procedure(s) Performed: LAPAROSCOPIC CHOLECYSTECTOMY single site (N/A Abdomen)     Patient location during evaluation: PACU Anesthesia Type: General Level of consciousness: sedated Pain management: pain level controlled Vital Signs Assessment: post-procedure vital signs reviewed and stable Respiratory status: spontaneous breathing and respiratory function stable Cardiovascular status: stable Postop Assessment: no apparent nausea or vomiting Anesthetic complications: no    Last Vitals:  Vitals:   01/15/20 1055 01/15/20 1115  BP: 134/86 124/75  Pulse: 82 80  Resp: 14 18  Temp: 37.3 C 37.2 C  SpO2: 94% 99%    Last Pain:  Vitals:   01/15/20 1115  TempSrc: Oral  PainSc:                  Lindsey Gordon

## 2020-01-15 NOTE — Interval H&P Note (Signed)
History and Physical Interval Note:  01/15/2020 8:01 AM  Lindsey Gordon  has presented today for surgery, with the diagnosis of acute cholecystitis.  The various methods of treatment have been discussed with the patient and family. After consideration of risks, benefits and other options for treatment, the patient has consented to  Procedure(s): LAPAROSCOPIC CHOLECYSTECTOMY WITH INTRAOPERATIVE CHOLANGIOGRAM (N/A) as a surgical intervention.  The patient's history has been reviewed, patient examined, no change in status, stable for surgery.  I have reviewed the patient's chart and labs.  Questions were answered to the patient's satisfaction.    I have re-reviewed the the patient's records, history, medications, and allergies.  I have re-examined the patient.  I again discussed intraoperative plans and goals of post-operative recovery.  The patient agrees to proceed.  Lindsey Gordon  July 25, 1976 [id number]  Patient Care Team: Patient, No Pcp Per as PCP - General (General Practice)  Patient Active Problem List   Diagnosis Date Noted   Acute calculous cholecystitis 01/14/2020    History reviewed. No pertinent past medical history.  History reviewed. No pertinent surgical history.  Social History   Socioeconomic History   Marital status: Married    Spouse name: Not on file   Number of children: Not on file   Years of education: Not on file   Highest education level: Not on file  Occupational History   Not on file  Tobacco Use   Smoking status: Current Every Day Smoker    Packs/day: 0.25    Types: Cigarettes   Smokeless tobacco: Never Used  Substance and Sexual Activity   Alcohol use: Not Currently   Drug use: Never   Sexual activity: Not on file  Other Topics Concern   Not on file  Social History Narrative   Not on file   Social Determinants of Health   Financial Resource Strain:    Difficulty of Paying Living Expenses:   Food Insecurity:    Worried About Programme researcher, broadcasting/film/video  in the Last Year:    Barista in the Last Year:   Transportation Needs:    Freight forwarder (Medical):    Lack of Transportation (Non-Medical):   Physical Activity:    Days of Exercise per Week:    Minutes of Exercise per Session:   Stress:    Feeling of Stress :   Social Connections:    Frequency of Communication with Friends and Family:    Frequency of Social Gatherings with Friends and Family:    Attends Religious Services:    Active Member of Clubs or Organizations:    Attends Engineer, structural:    Marital Status:   Intimate Partner Violence:    Fear of Current or Ex-Partner:    Emotionally Abused:    Physically Abused:    Sexually Abused:     History reviewed. No pertinent family history.  Medications Prior to Admission  Medication Sig Dispense Refill Last Dose   calcium carbonate (TUMS - DOSED IN MG ELEMENTAL CALCIUM) 500 MG chewable tablet Chew 1 tablet by mouth as needed for indigestion or heartburn.   Past Week at Unknown time   OVER THE COUNTER MEDICATION Take 1 tablet by mouth daily as needed (allergies). Allergy medicine   Past Week at Unknown time   pantoprazole (PROTONIX) 40 MG tablet Take 40 mg by mouth daily.   unknown    Current Facility-Administered Medications  Medication Dose Route Frequency Provider Last Rate Last Admin   acetaminophen (TYLENOL)  500 MG tablet            acetaminophen (TYLENOL) tablet 650 mg  650 mg Oral Q6H PRN Almond Lint, MD       Or   acetaminophen (TYLENOL) suppository 650 mg  650 mg Rectal Q6H PRN Almond Lint, MD       bupivacaine liposome (EXPAREL) 1.3 % injection 266 mg  20 mL Infiltration Once Karie Soda, MD       calcium carbonate (TUMS - dosed in mg elemental calcium) chewable tablet 200 mg of elemental calcium  1 tablet Oral PRN Almond Lint, MD       cefTRIAXone (ROCEPHIN) 2 g in sodium chloride 0.9 % 100 mL IVPB  2 g Intravenous QHS Almond Lint, MD 200 mL/hr at 01/15/20 0145 2 g at 01/15/20  0145   celecoxib (CELEBREX) 200 MG capsule            dextrose 5 % in lactated ringers infusion   Intravenous Continuous Almond Lint, MD 100 mL/hr at 01/15/20 0154 New Bag at 01/15/20 0154   diphenhydrAMINE (BENADRYL) 12.5 MG/5ML elixir 12.5 mg  12.5 mg Oral Q6H PRN Almond Lint, MD       Or   diphenhydrAMINE (BENADRYL) injection 12.5 mg  12.5 mg Intravenous Q6H PRN Almond Lint, MD       enoxaparin (LOVENOX) injection 40 mg  40 mg Subcutaneous Q24H Almond Lint, MD       ibuprofen (ADVIL) tablet 600 mg  600 mg Oral Q6H PRN Almond Lint, MD       methocarbamol (ROBAXIN) tablet 500 mg  500 mg Oral Q6H PRN Almond Lint, MD       morphine 2 MG/ML injection 1-2 mg  1-2 mg Intravenous Q2H PRN Almond Lint, MD   2 mg at 01/15/20 0440   ondansetron (ZOFRAN-ODT) disintegrating tablet 4 mg  4 mg Oral Q6H PRN Almond Lint, MD       Or   ondansetron (ZOFRAN) injection 4 mg  4 mg Intravenous Q6H PRN Almond Lint, MD   4 mg at 01/14/20 2342   oxyCODONE (Oxy IR/ROXICODONE) immediate release tablet 5-10 mg  5-10 mg Oral Q4H PRN Almond Lint, MD       prochlorperazine (COMPAZINE) tablet 10 mg  10 mg Oral Q6H PRN Almond Lint, MD       Or   prochlorperazine (COMPAZINE) injection 5-10 mg  5-10 mg Intravenous Q6H PRN Almond Lint, MD       simethicone (MYLICON) chewable tablet 40 mg  40 mg Oral Q6H PRN Almond Lint, MD         Allergies  Allergen Reactions   Other Dermatitis    Dermabond- blistering and redness extending beyond the site of the glue.     BP (!) 155/88 (BP Location: Right Arm)   Pulse 70   Temp 99.6 F (37.6 C) (Oral)   Resp 16   Ht 5\' 5"  (1.651 m)   Wt 95.4 kg   SpO2 98%   BMI 35.00 kg/m   Labs: Results for orders placed or performed during the hospital encounter of 01/14/20 (from the past 48 hour(s))  CBC with Differential     Status: Abnormal   Collection Time: 01/14/20  3:11 PM  Result Value Ref Range   WBC 12.2 (H) 4.0 - 10.5 K/uL   RBC 4.40 3.87 - 5.11  MIL/uL   Hemoglobin 13.8 12.0 - 15.0 g/dL   HCT 03/15/20 25.9 - 56.3 %   MCV 95.2 80.0 -  100.0 fL   MCH 31.4 26.0 - 34.0 pg   MCHC 32.9 30.0 - 36.0 g/dL   RDW 14.8 11.5 - 15.5 %   Platelets 422 (H) 150 - 400 K/uL   nRBC 0.0 0.0 - 0.2 %   Neutrophils Relative % 84 %   Neutro Abs 10.4 (H) 1.7 - 7.7 K/uL   Lymphocytes Relative 12 %   Lymphs Abs 1.4 0.7 - 4.0 K/uL   Monocytes Relative 3 %   Monocytes Absolute 0.3 0.1 - 1.0 K/uL   Eosinophils Relative 0 %   Eosinophils Absolute 0.0 0.0 - 0.5 K/uL   Basophils Relative 0 %   Basophils Absolute 0.0 0.0 - 0.1 K/uL   Immature Granulocytes 1 %   Abs Immature Granulocytes 0.06 0.00 - 0.07 K/uL    Comment: Performed at [facility], [place] [address] [place], [place] [address]  Comprehensive metabolic panel     Status: Abnormal   Collection Time: 01/14/20  3:11 PM  Result Value Ref Range   Sodium 140 135 - 145 mmol/L   Potassium 3.5 3.5 - 5.1 mmol/L   Chloride 103 98 - 111 mmol/L   CO2 28 22 - 32 mmol/L   Glucose, Bld 128 (H) 70 - 99 mg/dL    Comment: Glucose reference range applies only to samples taken after fasting for at least 8 hours.   BUN 8 6 - 20 mg/dL   Creatinine, Ser 0.67 0.44 - 1.00 mg/dL   Calcium 8.8 (L) 8.9 - 10.3 mg/dL   Total Protein 8.1 6.5 - 8.1 g/dL   Albumin 4.2 3.5 - 5.0 g/dL   AST 21 15 - 41 U/L   ALT 25 0 - 44 U/L   Alkaline Phosphatase 58 38 - 126 U/L   Total Bilirubin 0.5 0.3 - 1.2 mg/dL   GFR calc non Af Amer >60 >60 mL/min   GFR calc Af Amer >60 >60 mL/min   Anion gap 9 5 - 15    Comment: Performed at [facility], [place] [address] [place], [place] [address]  Lipase, blood     Status: None   Collection Time: 01/14/20  3:11 PM  Result Value Ref Range   Lipase 24 11 - 51 U/L    Comment: Performed at [facility], [place] [address] [place], [place] [address]  Urinalysis, Routine w reflex microscopic     Status: Abnormal   Collection Time: 01/14/20  3:11 PM   Result Value Ref Range   Color, Urine YELLOW YELLOW   APPearance CLEAR CLEAR   Specific Gravity, Urine 1.016 1.005 - 1.030   pH 9.0 (H) 5.0 - 8.0   Glucose, UA NEGATIVE NEGATIVE mg/dL   Hgb urine dipstick NEGATIVE NEGATIVE   Bilirubin Urine NEGATIVE NEGATIVE   Ketones, ur 20 (A) NEGATIVE mg/dL   Protein, ur NEGATIVE NEGATIVE mg/dL   Nitrite NEGATIVE NEGATIVE   Leukocytes,Ua NEGATIVE NEGATIVE    Comment: Performed at [place] [address], [place], [place] [address]  I-Stat beta hCG blood, ED     Status: None   Collection Time: 01/14/20  3:23 PM  Result Value Ref Range   I-stat hCG, quantitative <5.0 <5 mIU/mL   Comment 3            Comment:   GEST. AGE      CONC.  (mIU/mL)   <=1 WEEK        5 - 50  2 WEEKS       50 - 500     3 WEEKS       100 - 10,000     4 WEEKS     1,000 - 30,000        FEMALE AND NON-PREGNANT FEMALE:     LESS THAN 5 mIU/mL   Respiratory Panel by RT PCR (Flu A&B, Covid) - Nasopharyngeal Swab     Status: None   Collection Time: 01/14/20  7:59 PM   Specimen: Nasopharyngeal Swab  Result Value Ref Range   SARS Coronavirus 2 by RT PCR NEGATIVE NEGATIVE    Comment: (NOTE) SARS-CoV-2 target nucleic acids are NOT DETECTED. The SARS-CoV-2 RNA is generally detectable in upper respiratoy specimens during the acute phase of infection. The lowest concentration of SARS-CoV-2 viral copies this assay can detect is 131 copies/mL. A negative result does not preclude SARS-Cov-2 infection and should not be used as the sole basis for treatment or other patient management decisions. A negative result may occur with  improper specimen collection/handling, submission of specimen other than nasopharyngeal swab, presence of viral mutation(s) within the areas targeted by this assay, and inadequate number of viral copies (<131 copies/mL). A negative result must be combined with clinical observations, patient history, and epidemiological information.  The expected result is Negative. Fact Sheet for Patients:  https://www.moore.com/ Fact Sheet for Healthcare Providers:  https://www.young.biz/ This test is not yet ap proved or cleared by the [place] FDA and  has been authorized for detection and/or diagnosis of SARS-CoV-2 by FDA under an Emergency Use Authorization (EUA). This EUA will remain  in effect (meaning this test can be used) for the duration of the COVID-19 declaration under Section 564(b)(1) of the Act, 21 U.S.C. section 360bbb-3(b)(1), unless the authorization is terminated or revoked sooner.    Influenza A by PCR NEGATIVE NEGATIVE   Influenza B by PCR NEGATIVE NEGATIVE    Comment: (NOTE) The Xpert Xpress SARS-CoV-2/FLU/RSV assay is intended as an aid in  the diagnosis of influenza from Nasopharyngeal swab specimens and  should not be used as a sole basis for treatment. Nasal washings and  aspirates are unacceptable for Xpert Xpress SARS-CoV-2/FLU/RSV  testing. Fact Sheet for Patients: https://www.moore.com/ Fact Sheet for Healthcare Providers: https://www.young.biz/ This test is not yet approved or cleared by the [place] FDA and  has been authorized for detection and/or diagnosis of SARS-CoV-2 by  FDA under an Emergency Use Authorization (EUA). This EUA will remain  in effect (meaning this test can be used) for the duration of the  Covid-19 declaration under Section 564(b)(1) of the Act, 21  U.S.C. section 360bbb-3(b)(1), unless the authorization is  terminated or revoked. Performed at [facility], [address] [place], [place] [address]   Comprehensive metabolic panel     Status: Abnormal   Collection Time: 01/15/20  5:08 AM  Result Value Ref Range   Sodium 139 135 - 145 mmol/L   Potassium 3.1 (L) 3.5 - 5.1 mmol/L   Chloride 101 98 - 111 mmol/L   CO2 26 22 - 32 mmol/L   Glucose, Bld 131 (H) 70 - 99  mg/dL    Comment: Glucose reference range applies only to samples taken after fasting for at least 8 hours.   BUN <5 (L) 6 - 20 mg/dL   Creatinine, Ser 4.23 0.44 - 1.00 mg/dL   Calcium 8.4 (L) 8.9 - 10.3 mg/dL   Total Protein 7.0 6.5 - 8.1 g/dL  Albumin 3.7 3.5 - 5.0 g/dL   AST 20 15 - 41 U/L   ALT 24 0 - 44 U/L   Alkaline Phosphatase 49 38 - 126 U/L   Total Bilirubin 0.6 0.3 - 1.2 mg/dL   GFR calc non Af Amer >60 >60 mL/min   GFR calc Af Amer >60 >60 mL/min   Anion gap 12 5 - 15    Comment: Performed at [facility], [address] [place], [place] [address]  CBC     Status: Abnormal   Collection Time: 01/15/20  5:08 AM  Result Value Ref Range   WBC 16.7 (H) 4.0 - 10.5 K/uL   RBC 4.17 3.87 - 5.11 MIL/uL   Hemoglobin 12.7 12.0 - 15.0 g/dL   HCT 29.539.5 62.136.0 - 30.846.0 %   MCV 94.7 80.0 - 100.0 fL   MCH 30.5 26.0 - 34.0 pg   MCHC 32.2 30.0 - 36.0 g/dL   RDW 65.714.7 84.611.5 - 96.215.5 %   Platelets 429 (H) 150 - 400 K/uL   nRBC 0.0 0.0 - 0.2 %    Comment: Performed at [facility], [address] [place], [place] [address]    Imaging / Studies: US Abdomen Limited RUQ  Result Date: 01/14/2020 CLINICAL DATA:  Pain. EXAM: ULTRASOUND ABDOMEN LIMITED RIGHT UPPER QUADRANT COMPARISON:  None. FINDINGS: Gallbladder: Cholelithiasis is identified including a stone in the neck of the gallbladder which is non mobile. Positive Murphy's sign is reported. No wall thickening. Trace pericholecystic fluid is seen near the neck of the gallbladder. The largest stone in the gallbladder measures 2.1 cm. Common bile duct: Diameter: 4.7 mm Liver: No focal lesion identified. Within normal limits in parenchymal echogenicity. Portal vein is patent on color Doppler imaging with normal direction of blood flow towards the liver. Other: None. IMPRESSION: 1. Cholelithiasis with a non mobile stone in the neck of the gallbladder, a positive Murphy's sign, and trace pericholecystic fluid near the  neck of the gallbladder. The constellation of findings is concerning for acute cholecystitis. If the clinical picture remains ambiguous, a HIDA scan could further evaluate. Electronically Signed   By: Gerome Samavid  Williams III M.D   On: 01/14/2020 18:47     .Ardeth SportsmanSteven C. Rennie Hack, M.D., F.A.C.S. Gastrointestinal and Minimally Invasive Surgery [facility] [address] [place], [place] [address] [phone] Main / Paging  01/15/2020 8:01 AM    Ardeth SportsmanSteven C Deniyah Dillavou

## 2020-01-15 NOTE — Progress Notes (Signed)
Clear liquids provided. Tolerating well. No N/V.   Pain and surgical incision assessed. CDI. Pain 3/10. Patient reports she is comfortable.

## 2020-01-15 NOTE — Plan of Care (Signed)
  Problem: Clinical Measurements: Goal: Will remain free from infection Outcome: Progressing   Problem: Clinical Measurements: Goal: Diagnostic test results will improve Outcome: Progressing   Problem: Coping: Goal: Level of anxiety will decrease Outcome: Progressing   Problem: Nutrition: Goal: Adequate nutrition will be maintained Outcome: Progressing   Problem: Pain Managment: Goal: General experience of comfort will improve Outcome: Progressing   Problem: Safety: Goal: Ability to remain free from injury will improve Outcome: Progressing

## 2020-01-15 NOTE — Progress Notes (Signed)
I discussed operative findings, updated the patient's status, discussed probable steps to recovery, and gave postoperative recommendations to the patient and nurse.  Recommendations were made.  Questions were answered.  They expressed understanding & appreciation.

## 2020-01-16 MED ORDER — ACETAMINOPHEN 500 MG PO TABS: 1000.0000 mg | ORAL_TABLET | Freq: Four times a day (QID) | ORAL | 0 refills | Status: AC | PRN

## 2020-01-16 MED ORDER — IBUPROFEN 600 MG PO TABS: 600.0000 mg | ORAL_TABLET | Freq: Four times a day (QID) | ORAL | 0 refills | Status: AC | PRN

## 2020-01-16 MED ORDER — POLYETHYLENE GLYCOL 3350 17 G PO PACK: 17.0000 g | PACK | Freq: Every day | ORAL | 0 refills | Status: AC | PRN

## 2020-01-16 MED ORDER — PANTOPRAZOLE SODIUM 40 MG PO TBEC: 40.0000 mg | DELAYED_RELEASE_TABLET | Freq: Every day | ORAL | 0 refills | Status: AC

## 2020-01-16 MED ORDER — OXYCODONE HCL 5 MG PO TABS: 5.0000 mg | ORAL_TABLET | Freq: Four times a day (QID) | ORAL | 0 refills | Status: AC | PRN

## 2020-01-16 NOTE — Discharge Summary (Signed)
[facility] Discharge Summary   Patient ID: Lindsey Gordon MRN: [id number] DOB/AGE: November 16, 1975 44 y.o.  Admit date: 01/14/2020 Discharge date: 01/16/2020  Admitting Diagnosis: Acute cholecystitis  Discharge Diagnosis Patient Active Problem List   Diagnosis Date Noted  . GERD (gastroesophageal reflux disease) 01/15/2020  . Obesity (BMI 30-39.9) 01/15/2020  . Hyperglycemia 01/15/2020  . Acute calculous cholecystitis s/p lap cholecystectomy 01/15/2020 01/14/2020    Consultants None  Imaging: US Abdomen Limited RUQ  Result Date: 01/14/2020 CLINICAL DATA:  Pain. EXAM: ULTRASOUND ABDOMEN LIMITED RIGHT UPPER QUADRANT COMPARISON:  None. FINDINGS: Gallbladder: Cholelithiasis is identified including a stone in the neck of the gallbladder which is non mobile. Positive Murphy's sign is reported. No wall thickening. Trace pericholecystic fluid is seen near the neck of the gallbladder. The largest stone in the gallbladder measures 2.1 cm. Common bile duct: Diameter: 4.7 mm Liver: No focal lesion identified. Within normal limits in parenchymal echogenicity. Portal vein is patent on color Doppler imaging with normal direction of blood flow towards the liver. Other: None. IMPRESSION: 1. Cholelithiasis with a non mobile stone in the neck of the gallbladder, a positive Murphy's sign, and trace pericholecystic fluid near the neck of the gallbladder. The constellation of findings is concerning for acute cholecystitis. If the clinical picture remains ambiguous, a HIDA scan could further evaluate. Electronically Signed   By: Dorise Bullion III M.D   On: 01/14/2020 18:47    Procedures Dr. Johney [place] (01/05/2020) - Single site Laparoscopic Cholecystectomy   Hospital Course:  Lindsey Gordon is a 44yo female who [facility] to [facility] with sudden onset severe abdominal pain.  Workup showed Acute calculous cholecystitis - appears to be early given timing and appearance on imaging.  Patient was admitted and  underwent procedure listed above.  Tolerated procedure well and was transferred to the floor.  Diet was advanced as tolerated.  On POD1 the patient was voiding well, tolerating diet, ambulating well, pain well controlled, vital signs stable, incisions c/d/i and felt stable for discharge home.  Patient will follow up as below and knows to call with questions or concerns.    I have personally reviewed the patients medication history on the [place] controlled substance database.    Physical Exam: General:  Alert, NAD, comfortable Pulm: rate and effort normal Abd:  Soft, ND, appropriately tender, +BS, single periumbilical incision with cdi dressing in place/ some blood noted on dressing  Allergies as of 01/16/2020      Reactions   Other Dermatitis   Dermabond- blistering and redness extending beyond the site of the glue.    Cyanoacrylate Rash   RASH TO DERMABOND      Medication List    TAKE these medications   acetaminophen 500 MG tablet Commonly known as: TYLENOL Take 2 tablets (1,000 mg total) by mouth every 6 (six) hours as needed for mild pain.   calcium carbonate 500 MG chewable tablet Commonly known as: TUMS - dosed in mg elemental calcium Chew 1 tablet by mouth as needed for indigestion or heartburn.   ibuprofen 600 MG tablet Commonly known as: ADVIL Take 1 tablet (600 mg total) by mouth every 6 (six) hours as needed for mild pain.   OVER THE COUNTER MEDICATION Take 1 tablet by mouth daily as needed (allergies). Allergy medicine   oxyCODONE 5 MG immediate release tablet Commonly known as: Oxy IR/ROXICODONE Take 1 tablet (5 mg total) by mouth every 6 (six) hours as needed for severe pain.   pantoprazole 40 MG tablet  Commonly known as: PROTONIX Take 1 tablet (40 mg total) by mouth daily.   polyethylene glycol 17 g packet Commonly known as: MiraLax Take 17 g by mouth daily as needed for mild constipation.        Follow-up Information    [facility], [place].  Schedule an appointment as soon as possible for a visit in 3 weeks.   Specialty: General Surgery Why: To follow up after your operation Contact information: [address] [place] [address] [phone]       [facility]. Call.   Why: call to establish a primary care physician Contact information: [address] [place] [address] [phone]          Signed: Franne Forts, [facility] 01/16/2020, 9:28 AM Please see Amion for pager number during day hours 7:00am-4:30pm

## 2020-01-16 NOTE — Discharge Instructions (Signed)
LAPAROSCOPIC SURGERY: POST OP INSTRUCTIONS  ######################################################################  EAT Gradually transition to a high fiber diet with a fiber supplement over the next few weeks after discharge.  Start with a pureed / full liquid diet (see below)  WALK Walk an hour a day.  Control your pain to do that.    CONTROL PAIN Control pain so that you can walk, sleep, tolerate sneezing/coughing, go up/down stairs.  HAVE A BOWEL MOVEMENT DAILY Keep your bowels regular to avoid problems.  OK to try a laxative to override constipation.  OK to use an antidairrheal to slow down diarrhea.  Call if not better after 2 tries  CALL IF YOU HAVE PROBLEMS/CONCERNS Call if you are still struggling despite following these instructions. Call if you have concerns not answered by these instructions  ######################################################################    1. DIET: Follow a light bland diet & liquids the first 24 hours after arrival home, such as soup, liquids, starches, etc.  Be sure to drink plenty of fluids.  Quickly advance to a usual solid diet within a few days.  Avoid fast food or heavy meals as your are more likely to get nauseated or have irregular bowels.  A low-fat, high-fiber diet for the rest of your life is ideal.  2. Take your usually prescribed home medications unless otherwise directed.  3. PAIN CONTROL: a. Pain is best controlled by a usual combination of three different methods TOGETHER: i. Ice/Heat ii. Over the counter pain medication iii. Prescription pain medication b. Most patients will experience some swelling and bruising around the incisions.  Ice packs or heating pads (30-60 minutes up to 6 times a day) will help. Use ice for the first few days to help decrease swelling and bruising, then switch to heat to help relax tight/sore spots and speed recovery.  Some people prefer to use ice alone, heat alone, alternating between ice & heat.   Experiment to what works for you.  Swelling and bruising can take several weeks to resolve.   c. It is helpful to take an over-the-counter pain medication regularly for the first few weeks.  Choose one of the following that works best for you: i. Naproxen (Aleve, etc)  Two 220mg tabs twice a day ii. Ibuprofen (Advil, etc) Three 200mg tabs four times a day (every meal & bedtime) iii. Acetaminophen (Tylenol, etc) 500-650mg four times a day (every meal & bedtime) d. A  prescription for pain medication (such as oxycodone, hydrocodone, tramadol, gabapentin, methocarbamol, etc) should be given to you upon discharge.  Take your pain medication as prescribed.  i. If you are having problems/concerns with the prescription medicine (does not control pain, nausea, vomiting, rash, itching, etc), please call us [phone] to see if we need to switch you to a different pain medicine that will work better for you and/or control your side effect better. ii. If you need a refill on your pain medication, please give us 48 hour notice.  contact your pharmacy.  They will contact our office to request authorization. Prescriptions will not be filled after 5 pm or on week-ends  4. Avoid getting constipated.   a. Between the surgery and the pain medications, it is common to experience some constipation.   b. Increasing fluid intake and taking a fiber supplement (such as Metamucil, Citrucel, FiberCon, MiraLax, etc) 1-2 times a day regularly will usually help prevent this problem from occurring.   c. A mild laxative (prune juice, Milk of Magnesia, MiraLax, etc) should be taken according to   package directions if there are no bowel movements after 48 hours.   5. Watch out for diarrhea.   a. If you have many loose bowel movements, simplify your diet to bland foods & liquids for a few days.   b. Stop any stool softeners and decrease your fiber supplement.   c. Switching to mild anti-diarrheal medications (Kayopectate, Pepto  Bismol) can help.   d. If this worsens or does not improve, please call us.  6. Wash / shower every day.  You may shower over the dressings as they are waterproof.  Continue to shower over incision(s) after the dressing is off.  7. Remove your waterproof bandages 5 days after surgery.  You may leave the incision open to air.  You may replace a dressing/Band-Aid to cover the incision for comfort if you wish.   8. ACTIVITIES as tolerated:   a. You may resume regular (light) daily activities beginning the next day--such as daily self-care, walking, climbing stairs--gradually increasing activities as tolerated.  If you can walk 30 minutes without difficulty, it is safe to try more intense activity such as jogging, treadmill, bicycling, low-impact aerobics, swimming, etc. b. Save the most intensive and strenuous activity for last such as sit-ups, heavy lifting, contact sports, etc  Refrain from any heavy lifting or straining until you are off narcotics for pain control.   c. DO NOT PUSH THROUGH PAIN.  Let pain be your guide: If it hurts to do something, don't do it.  Pain is your body warning you to avoid that activity for another week until the pain goes down. d. You may drive when you are no longer taking prescription pain medication, you can comfortably wear a seatbelt, and you can safely maneuver your car and apply brakes. e. You may have sexual intercourse when it is comfortable.  9. FOLLOW UP in our office a. Please call [facility] at [phone] to set up an appointment to see your surgeon in the office for a follow-up appointment approximately 2-3 weeks after your surgery. b. Make sure that you call for this appointment the day you arrive home to insure a convenient appointment time.  10. IF YOU HAVE DISABILITY OR FAMILY LEAVE FORMS, BRING THEM TO THE OFFICE FOR PROCESSING.  DO NOT GIVE THEM TO YOUR DOCTOR.   WHEN TO CALL US [phone]: 1. Poor pain control 2. Reactions / problems with new  medications (rash/itching, nausea, etc)  3. Fever over 101.5 F (38.5 C) 4. Inability to urinate 5. Nausea and/or vomiting 6. Worsening swelling or bruising 7. Continued bleeding from incision. 8. Increased pain, redness, or drainage from the incision   The clinic staff is available to answer your questions during regular business hours (8:30am-5pm).  Please don't hesitate to call and ask to speak to one of our nurses for clinical concerns.   If you have a medical emergency, go to the nearest emergency room or call 911.  A surgeon from [facility] is always on call at the hospitals   [facility] [address], [address], [place], [place]  [address] ? MAIN: [phone] ? TOLL FREE: [phone] ?  FAX [phone] [email or web address]   Cholecystitis  Cholecystitis is inflammation of the gallbladder. It is often called a gallbladder attack. The gallbladder is a pear-shaped organ that lies beneath the liver on the right side of the body. The gallbladder stores bile, which is a fluid that helps the body digest fats. If bile builds up   in your gallbladder, your gallbladder becomes inflamed. This condition may occur suddenly. Cholecystitis is a serious condition and requires treatment. What are the causes? The most common cause of this condition is gallstones. Gallstones can block the tube (duct) that carries bile out of your gallbladder. This causes bile to build up. Other causes include:  Damage to the gallbladder due to a decrease in blood flow.  Infections in the bile ducts.  Scars or kinks in the bile ducts.  Tumors in the liver, pancreas, or gallbladder. What increases the risk? You are more likely to develop this condition if:  You have sickle cell disease.  You take birth control pills or use estrogen.  You have alcoholic liver disease.  You have liver cirrhosis.  You have your nutrition delivered through a vein  (parenteral nutrition).  You are critically ill.  You do not eat or drink for a long time. This is also called "fasting."  You are obese.  You lose weight too fast.  You are pregnant.  You have high levels of fat (triglycerides) in the blood.  You have pancreatitis. What are the signs or symptoms? Symptoms of this condition include:  Pain in the abdomen, especially in the upper right area of the abdomen.  Tenderness or bloating in the abdomen.  Nausea.  Vomiting.  Fever.  Chills. How is this diagnosed? This condition is diagnosed with a medical history and physical exam. You may also have other tests, including:  Imaging tests, such as: ? An ultrasound of the gallbladder. ? A CT scan of the abdomen. ? A gallbladder nuclear scan (HIDA scan). This scan allows your health care provider to see the bile moving from your liver to your gallbladder and on to your small intestine. ? MRI.  Blood tests, such as: ? A complete blood count. The white blood cell count may be higher than normal. ? Liver function tests. Certain types of gallstones cause some results to be higher than normal. How is this treated? Treatment may include:  Surgery to remove your gallbladder (cholecystectomy).  Antibiotic medicine, usually through an IV.  Fasting for a certain amount of time.  Giving IV fluids.  Medicine to treat pain or vomiting. Follow these instructions at home:  If you had surgery, follow instructions from your health care provider about home care after the procedure. Medicines   Take over-the-counter and prescription medicines only as told by your health care provider.  If you were prescribed an antibiotic medicine, take it as told by your health care provider. Do not stop taking the antibiotic even if you start to feel better. General instructions  Follow instructions from your health care provider about what to eat or drink. When you are allowed to eat, avoid eating  or drinking anything that triggers your symptoms.  Do not lift anything that is heavier than 10 lb (4.5 kg), or the limit that you are told, until your health care provider says that it is safe.  Do not use any products that contain nicotine or tobacco, such as cigarettes and e-cigarettes. If you need help quitting, ask your health care provider.  Keep all follow-up visits as told by your health care provider. This is important. Contact a health care provider if:  Your pain is not controlled with medicine.  You have a fever. Get help right away if:  Your pain moves to another part of your abdomen or to your back.  You continue to have symptoms or you develop new symptoms   even with treatment. Summary  Cholecystitis is inflammation of the gallbladder.  The most common cause of this condition is gallstones. Gallstones can block the tube (duct) that carries bile out of your gallbladder.  Common symptoms are pain in the abdomen, nausea, vomiting, fever, and chills.  This condition is treated with surgery to remove the gallbladder, medicines, fasting, and IV fluids.  Follow your health care provider's instructions for eating and drinking. Avoid eating anything that triggers your symptoms. This information is not intended to replace advice given to you by your health care provider. Make sure you discuss any questions you have with your health care provider. Document Revised: 01/29/2018 Document Reviewed: 01/29/2018 Elsevier Patient Education  2020 [employer]   

## 2020-01-16 NOTE — Progress Notes (Signed)
D/C instructions given to patient. Patient had no questions. NT or writer will wheel patient out once family gets the car

## 2020-01-16 NOTE — Progress Notes (Signed)
Patient's incision clean and intact. Patient left the unit , security was called to bring patient back to her room. Patient then requested to be discharged ASAP.

## 2020-01-17 LAB — SURGICAL PATHOLOGY

## 2021-03-21 IMAGING — US US ABDOMEN LIMITED
1 series · 14 of 25 positions shown · non-contrast
Comparison: None.

CLINICAL DATA: Pain.

EXAM:
ULTRASOUND ABDOMEN LIMITED RIGHT UPPER QUADRANT

[Series 1: us abdomen limited · 14 of 87 slices shown]
[im 1/87]
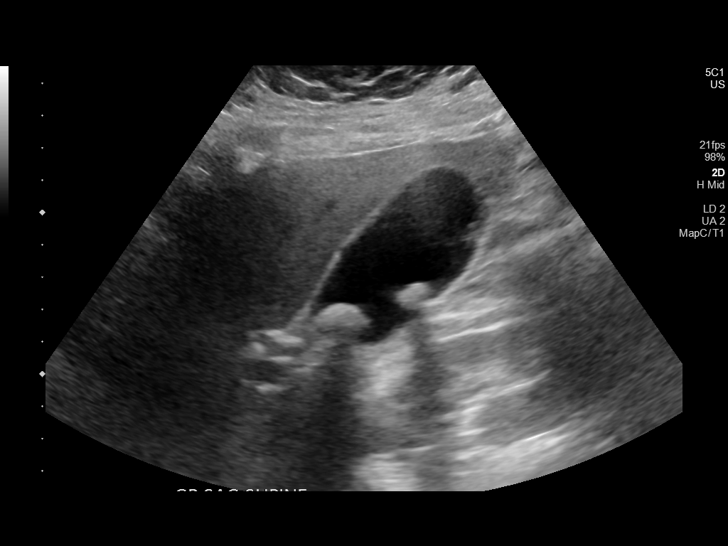
[im 8/87]
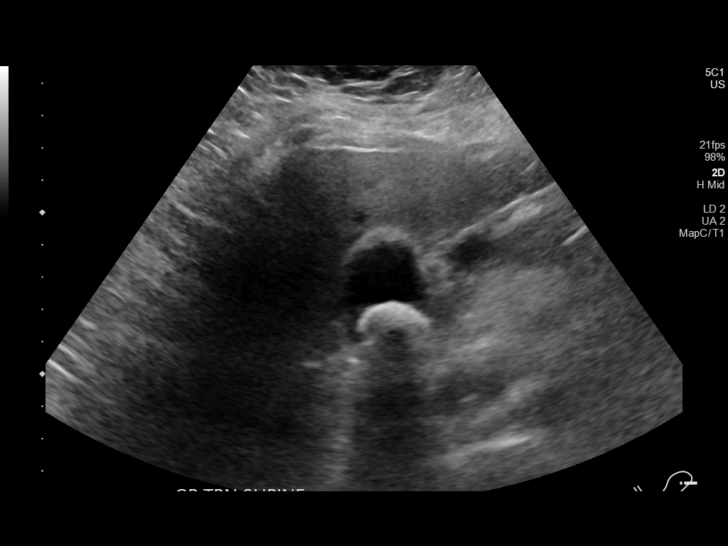
[im 15/87]
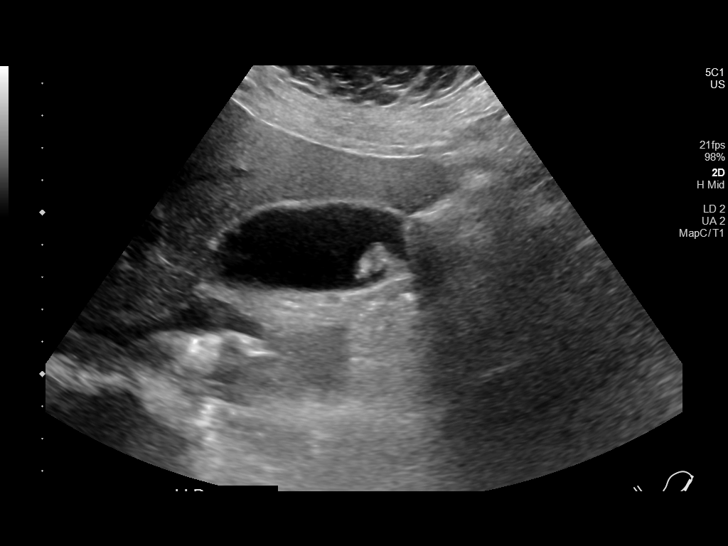
[im 22/87]
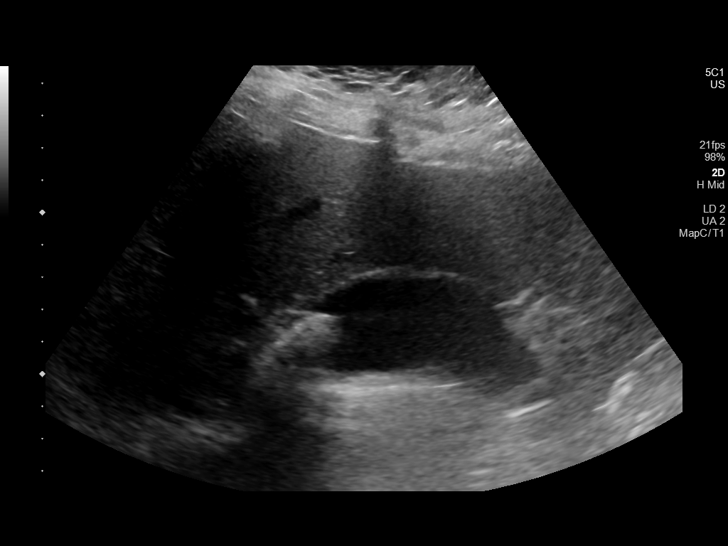
[im 29/87]
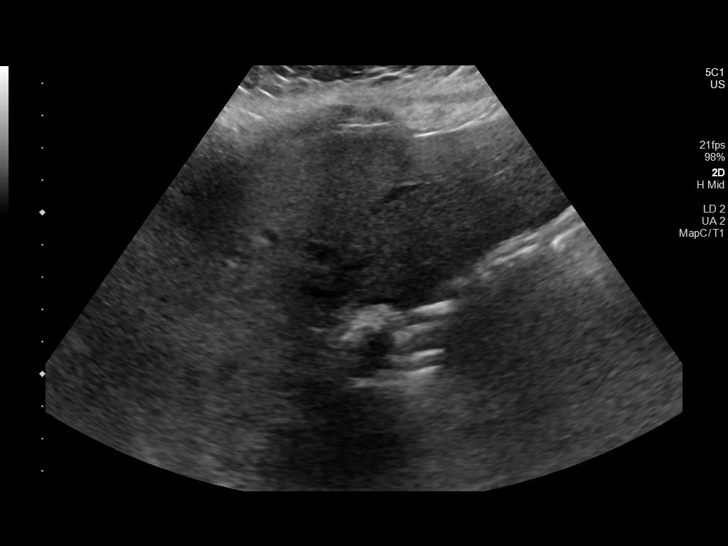
[im 33/87]
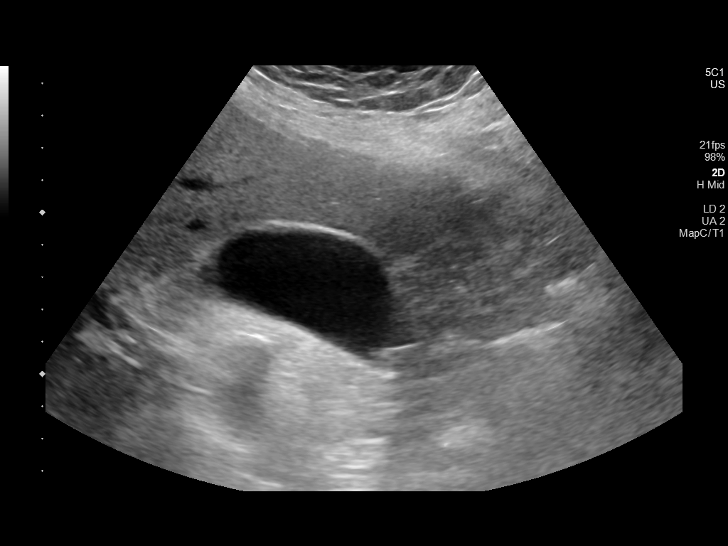
[im 40/87]
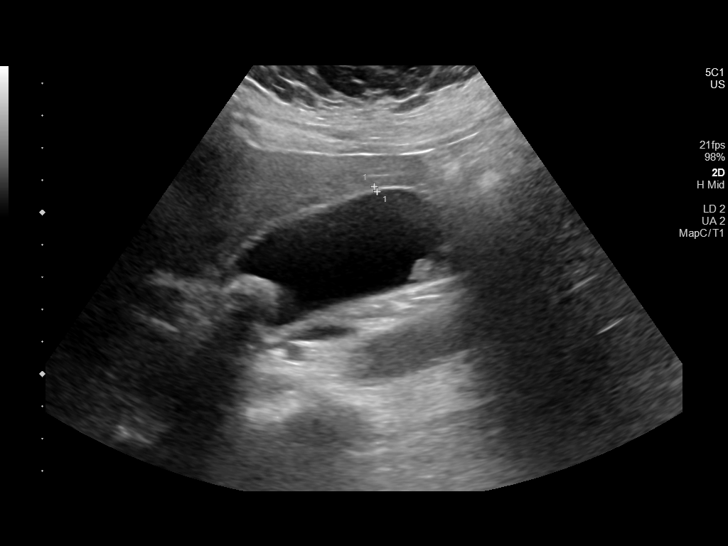
[im 47/87]
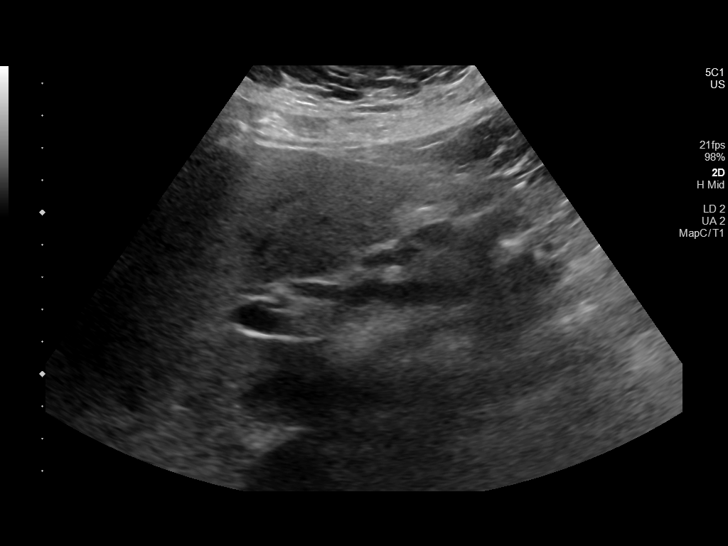
[im 54/87]
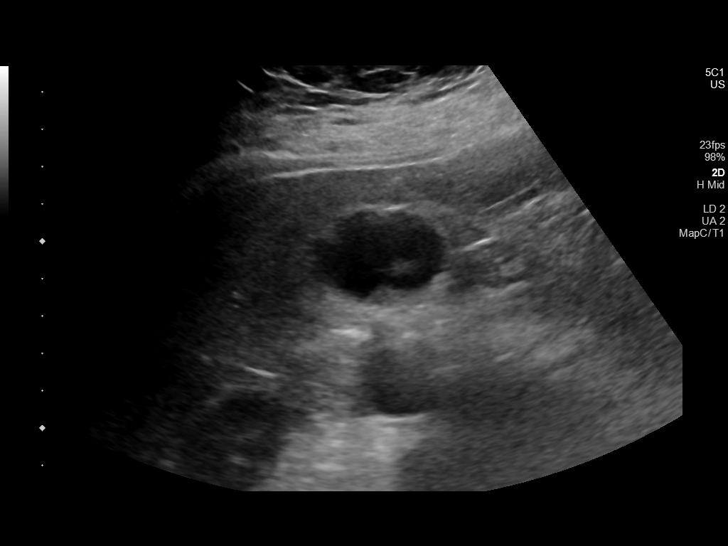
[im 58/87]
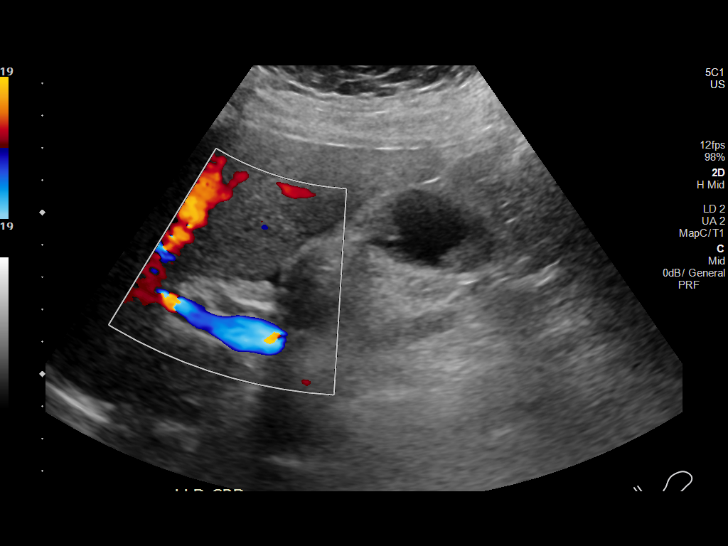
[im 65/87]
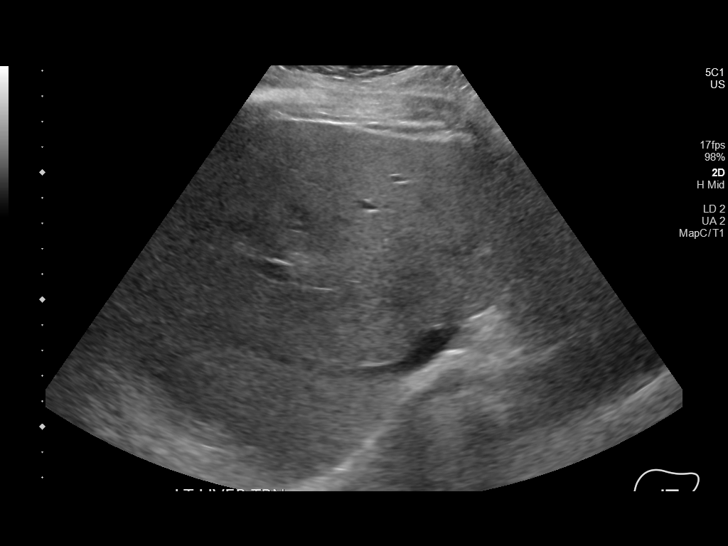
[im 72/87]
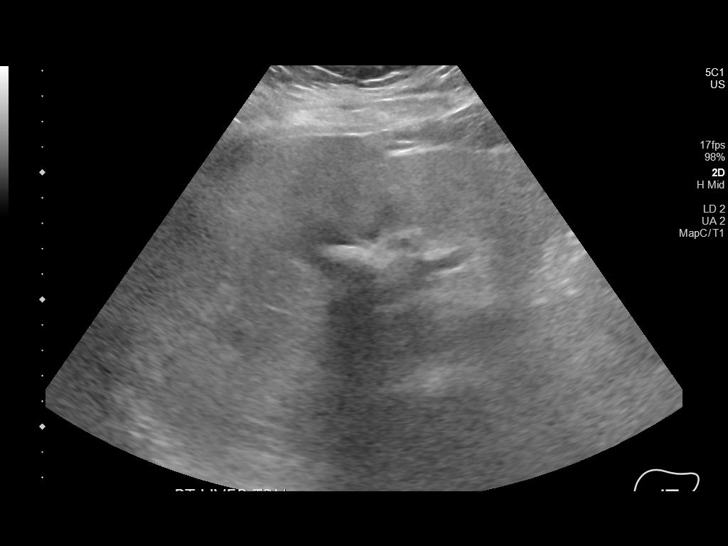
[im 79/87]
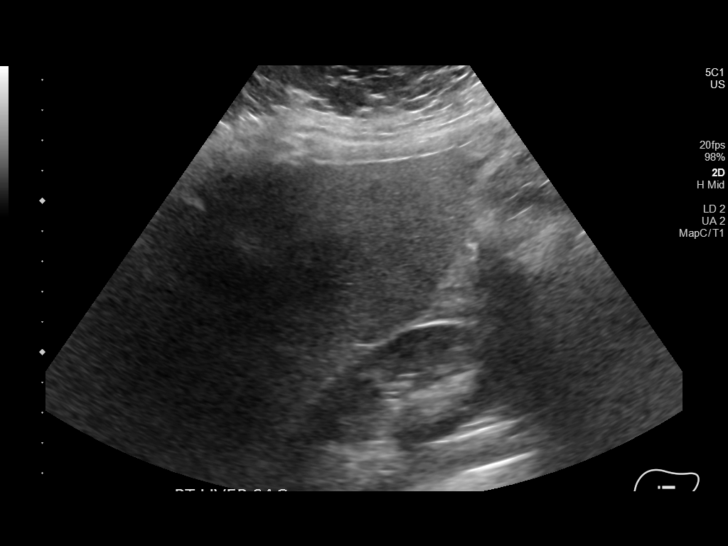
[im 87/87]
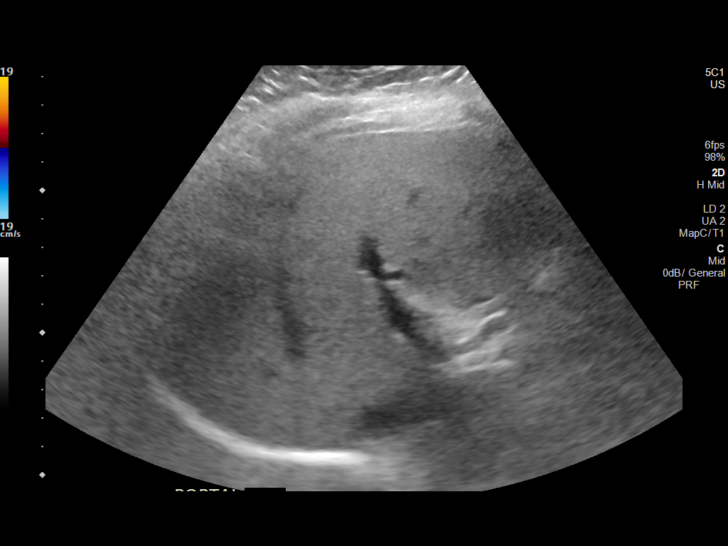

[14 of 25 positions shown; findings below may reference images not displayed]

FINDINGS: Gallbladder:

Cholelithiasis is identified including a stone in the neck of the
gallbladder which is non mobile. Positive Murphy's sign is reported.
No wall thickening. Trace pericholecystic fluid is seen near the
neck of the gallbladder. The largest stone in the gallbladder
measures 2.1 cm.

Common bile duct:

Diameter: 4.7 mm

Liver:

No focal lesion identified. Within normal limits in parenchymal
echogenicity. Portal vein is patent on color Doppler imaging with
normal direction of blood flow towards the liver.

Other: None.
IMPRESSION: 1. Cholelithiasis with a non mobile stone in the neck of the
gallbladder, a positive Murphy's sign, and trace pericholecystic
fluid near the neck of the gallbladder. The constellation of
findings is concerning for acute cholecystitis. If the clinical
picture remains ambiguous, a HIDA scan could further evaluate.
